# Patient Record
Sex: Male | Born: 1995 | Race: Black or African American | Hispanic: No | Smoking: Current every day smoker
Health system: Southern US, Community
[De-identification: ages and names within clinical notes are randomized; demographics above are authoritative.]

## PROBLEM LIST (undated history)

## (undated) DIAGNOSIS — Z789 Other specified health status: Secondary | ICD-10-CM

---

## 2021-09-18 ENCOUNTER — Emergency Department (HOSPITAL_COMMUNITY): Payer: Self-pay

## 2021-09-18 ENCOUNTER — Other Ambulatory Visit: Payer: Self-pay

## 2021-09-18 ENCOUNTER — Inpatient Hospital Stay (HOSPITAL_COMMUNITY)
Admission: EM | Admit: 2021-09-18 | Discharge: 2021-09-22 | DRG: 482 | Disposition: A | Discharge status: Home / Self Care | Payer: Self-pay | Attending: Orthopedic Surgery | Admitting: Orthopedic Surgery

## 2021-09-18 DIAGNOSIS — F17200 Nicotine dependence, unspecified, uncomplicated: Secondary | ICD-10-CM | POA: Diagnosis present

## 2021-09-18 DIAGNOSIS — T1490XA Injury, unspecified, initial encounter: Secondary | ICD-10-CM

## 2021-09-18 DIAGNOSIS — Z20822 Contact with and (suspected) exposure to covid-19: Secondary | ICD-10-CM | POA: Diagnosis present

## 2021-09-18 DIAGNOSIS — S7292XA Unspecified fracture of left femur, initial encounter for closed fracture: Secondary | ICD-10-CM

## 2021-09-18 DIAGNOSIS — S50812A Abrasion of left forearm, initial encounter: Secondary | ICD-10-CM | POA: Diagnosis present

## 2021-09-18 DIAGNOSIS — S72402A Unspecified fracture of lower end of left femur, initial encounter for closed fracture: Secondary | ICD-10-CM | POA: Diagnosis present

## 2021-09-18 DIAGNOSIS — T148XXA Other injury of unspecified body region, initial encounter: Secondary | ICD-10-CM

## 2021-09-18 DIAGNOSIS — T07XXXA Unspecified multiple injuries, initial encounter: Secondary | ICD-10-CM

## 2021-09-18 DIAGNOSIS — S80212A Abrasion, left knee, initial encounter: Secondary | ICD-10-CM | POA: Diagnosis present

## 2021-09-18 DIAGNOSIS — S72492A Other fracture of lower end of left femur, initial encounter for closed fracture: Principal | ICD-10-CM | POA: Diagnosis present

## 2021-09-18 DIAGNOSIS — S80811A Abrasion, right lower leg, initial encounter: Secondary | ICD-10-CM | POA: Diagnosis present

## 2021-09-18 DIAGNOSIS — S8992XA Unspecified injury of left lower leg, initial encounter: Secondary | ICD-10-CM | POA: Diagnosis present

## 2021-09-18 DIAGNOSIS — Y92410 Unspecified street and highway as the place of occurrence of the external cause: Secondary | ICD-10-CM | POA: Diagnosis not present

## 2021-09-18 HISTORY — DX: Other specified health status: Z78.9

## 2021-09-18 LAB — COMPREHENSIVE METABOLIC PANEL
ALT: 78 U/L — ABNORMAL HIGH (ref 0–44)
AST: 105 U/L — ABNORMAL HIGH (ref 15–41)
Albumin: 3.7 g/dL (ref 3.5–5.0)
Alkaline Phosphatase: 76 U/L (ref 38–126)
Anion gap: 11 (ref 5–15)
BUN: 12 mg/dL (ref 6–20)
CO2: 22 mmol/L (ref 22–32)
Calcium: 8.8 mg/dL — ABNORMAL LOW (ref 8.9–10.3)
Chloride: 104 mmol/L (ref 98–111)
Creatinine, Ser: 1.09 mg/dL (ref 0.61–1.24)
GFR, Estimated: >60 mL/min (ref >60)
Glucose, Bld: 111 mg/dL — ABNORMAL HIGH (ref 70–99)
Potassium: 3.8 mmol/L (ref 3.5–5.1)
Sodium: 137 mmol/L (ref 135–145)
Total Bilirubin: 0.3 mg/dL (ref 0.3–1.2)
Total Protein: 6.1 g/dL — ABNORMAL LOW (ref 6.5–8.1)

## 2021-09-18 LAB — CBC
HCT: 39.6 % (ref 39.0–52.0)
Hemoglobin: 13.3 g/dL (ref 13.0–17.0)
MCH: 28.7 pg (ref 26.0–34.0)
MCHC: 33.6 g/dL (ref 30.0–36.0)
MCV: 85.3 fL (ref 80.0–100.0)
Platelets: 275 10*3/uL (ref 150–400)
RBC: 4.64 MIL/uL (ref 4.22–5.81)
RDW: 12.5 % (ref 11.5–15.5)
WBC: 11.6 10*3/uL — ABNORMAL HIGH (ref 4.0–10.5)
nRBC: 0 % (ref 0.0–0.2)

## 2021-09-18 LAB — I-STAT CHEM 8, ED
BUN: 13 mg/dL (ref 6–20)
Calcium, Ion: 1.07 mmol/L — ABNORMAL LOW (ref 1.15–1.40)
Chloride: 104 mmol/L (ref 98–111)
Creatinine, Ser: 0.9 mg/dL (ref 0.61–1.24)
Glucose, Bld: 93 mg/dL (ref 70–99)
HCT: 42 % (ref 39.0–52.0)
Hemoglobin: 14.3 g/dL (ref 13.0–17.0)
Potassium: 4 mmol/L (ref 3.5–5.1)
Sodium: 137 mmol/L (ref 135–145)
TCO2: 24 mmol/L (ref 22–32)

## 2021-09-18 LAB — URINALYSIS, ROUTINE W REFLEX MICROSCOPIC
Bilirubin Urine: NEGATIVE
Glucose, UA: NEGATIVE mg/dL
Hgb urine dipstick: NEGATIVE
Ketones, ur: NEGATIVE mg/dL
Leukocytes,Ua: NEGATIVE
Nitrite: NEGATIVE
Protein, ur: NEGATIVE mg/dL
Specific Gravity, Urine: 1.024 (ref 1.005–1.030)
pH: 7 (ref 5.0–8.0)

## 2021-09-18 LAB — SAMPLE TO BLOOD BANK

## 2021-09-18 LAB — RESP PANEL BY RT-PCR (FLU A&B, COVID) ARPGX2
Influenza A by PCR: NEGATIVE
Influenza B by PCR: NEGATIVE
SARS Coronavirus 2 by RT PCR: NEGATIVE

## 2021-09-18 LAB — LACTIC ACID, PLASMA: Lactic Acid, Venous: 2.9 mmol/L (ref 0.5–1.9)

## 2021-09-18 MED ORDER — HYDROMORPHONE HCL 1 MG/ML IJ SOLN
0.5000 mg | INTRAMUSCULAR | Status: DC | PRN
  Administered 2021-09-19: 0.5 mg via INTRAVENOUS
  Administered 2021-09-19 – 2021-09-20 (×4): 1 mg via INTRAVENOUS
  Filled 2021-09-18 (×5): qty 1

## 2021-09-18 MED ORDER — SODIUM CHLORIDE 0.9 % IV SOLN: INTRAVENOUS | Status: DC

## 2021-09-18 MED ORDER — OXYCODONE HCL 5 MG PO TABS
5.0000 mg | ORAL_TABLET | ORAL | Status: DC | PRN
  Administered 2021-09-19 (×2): 5 mg via ORAL
  Filled 2021-09-18: qty 1
  Filled 2021-09-18: qty 2
  Filled 2021-09-18: qty 1

## 2021-09-18 MED ORDER — SODIUM CHLORIDE 0.9 % IV BOLUS
1000.0000 mL | Freq: Once | INTRAVENOUS | Status: AC
  Administered 2021-09-18: 1000 mL via INTRAVENOUS

## 2021-09-18 MED ORDER — METHOCARBAMOL 1000 MG/10ML IJ SOLN
500.0000 mg | Freq: Four times a day (QID) | INTRAVENOUS | Status: DC | PRN
  Filled 2021-09-18 (×3): qty 5

## 2021-09-18 MED ORDER — ENOXAPARIN SODIUM 40 MG/0.4ML IJ SOSY: 40.0000 mg | PREFILLED_SYRINGE | INTRAMUSCULAR | Status: DC

## 2021-09-18 MED ORDER — ONDANSETRON HCL 4 MG/2ML IJ SOLN
4.0000 mg | Freq: Four times a day (QID) | INTRAMUSCULAR | Status: DC | PRN
  Filled 2021-09-18: qty 2

## 2021-09-18 MED ORDER — HYDROMORPHONE HCL 1 MG/ML IJ SOLN
1.0000 mg | Freq: Once | INTRAMUSCULAR | Status: AC
  Administered 2021-09-18: 1 mg via INTRAVENOUS
  Filled 2021-09-18: qty 1

## 2021-09-18 MED ORDER — IOHEXOL 300 MG/ML  SOLN
100.0000 mL | Freq: Once | INTRAMUSCULAR | Status: AC | PRN
  Administered 2021-09-18: 100 mL via INTRAVENOUS

## 2021-09-18 MED ORDER — FENTANYL CITRATE PF 50 MCG/ML IJ SOSY
50.0000 ug | PREFILLED_SYRINGE | Freq: Once | INTRAMUSCULAR | Status: AC
  Administered 2021-09-18: 50 ug via INTRAVENOUS
  Filled 2021-09-18: qty 1

## 2021-09-18 MED ORDER — ACETAMINOPHEN 325 MG PO TABS
325.0000 mg | ORAL_TABLET | Freq: Four times a day (QID) | ORAL | Status: DC | PRN
  Administered 2021-09-20 (×2): 650 mg via ORAL
  Filled 2021-09-18 (×4): qty 2

## 2021-09-18 MED ORDER — OXYCODONE HCL 5 MG PO TABS
10.0000 mg | ORAL_TABLET | ORAL | Status: DC | PRN
  Administered 2021-09-18: 15 mg via ORAL
  Administered 2021-09-19: 10 mg via ORAL
  Administered 2021-09-20: 15 mg via ORAL
  Administered 2021-09-20: 10 mg via ORAL
  Administered 2021-09-20 – 2021-09-21 (×4): 15 mg via ORAL
  Filled 2021-09-18: qty 3
  Filled 2021-09-18: qty 2
  Filled 2021-09-18 (×2): qty 3
  Filled 2021-09-18: qty 2
  Filled 2021-09-18 (×3): qty 3

## 2021-09-18 MED ORDER — ONDANSETRON HCL 4 MG PO TABS: 4.0000 mg | ORAL_TABLET | Freq: Four times a day (QID) | ORAL | Status: DC | PRN

## 2021-09-18 MED ORDER — METHOCARBAMOL 500 MG PO TABS
500.0000 mg | ORAL_TABLET | Freq: Four times a day (QID) | ORAL | Status: DC | PRN
  Administered 2021-09-18 – 2021-09-22 (×5): 500 mg via ORAL
  Filled 2021-09-18 (×5): qty 1

## 2021-09-18 MED ORDER — ACETAMINOPHEN 500 MG PO TABS
1000.0000 mg | ORAL_TABLET | Freq: Four times a day (QID) | ORAL | Status: AC
  Administered 2021-09-18 – 2021-09-19 (×3): 1000 mg via ORAL
  Filled 2021-09-18 (×3): qty 2

## 2021-09-18 MED ORDER — KETOROLAC TROMETHAMINE 15 MG/ML IJ SOLN
15.0000 mg | Freq: Four times a day (QID) | INTRAMUSCULAR | Status: AC
  Administered 2021-09-19 (×4): 15 mg via INTRAVENOUS
  Filled 2021-09-18 (×4): qty 1

## 2021-09-18 NOTE — Progress Notes (Signed)
Orthopedic Tech Progress Note Patient Details:  Manuel Mckinney 1996-06-23 [id number]  Ortho Devices Type of Ortho Device: Knee Immobilizer Ortho Device/Splint Location: lle Ortho Device/Splint Interventions: Ordered  I left the knee immobilizer in room pt was in CT.     Zehra Rucci L Toshi Ishii 09/18/2021, 5:20 PM

## 2021-09-18 NOTE — ED Notes (Signed)
Pt repeatedly repositioned for comfort, yelling out periodically, RN in and out of room constantly attempting to reduce pain. Pt requested, then loosened knee immobilizer. Ortho tech consulted for advice, RN advised of securing methods for knee immobilizer, however pt reporting that this makes it worse. PRN to be given.
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
## 2021-09-18 NOTE — ED Notes (Signed)
C-collar removed at direction of MD Tegler
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
## 2021-09-18 NOTE — ED Notes (Signed)
Lactic 2.9. MD made aware.  

## 2021-09-19 ENCOUNTER — Encounter (HOSPITAL_COMMUNITY): Payer: Self-pay | Admitting: Orthopedic Surgery

## 2021-09-19 ENCOUNTER — Inpatient Hospital Stay (HOSPITAL_COMMUNITY): Payer: Self-pay | Admitting: Critical Care Medicine

## 2021-09-19 ENCOUNTER — Other Ambulatory Visit: Payer: Self-pay

## 2021-09-19 ENCOUNTER — Encounter (HOSPITAL_COMMUNITY)
Admission: EM | Disposition: A | Discharge status: Home / Self Care | Payer: Self-pay | Source: Home / Self Care | Attending: Orthopedic Surgery

## 2021-09-19 ENCOUNTER — Inpatient Hospital Stay (HOSPITAL_COMMUNITY): Payer: Self-pay

## 2021-09-19 DIAGNOSIS — S72402A Unspecified fracture of lower end of left femur, initial encounter for closed fracture: Secondary | ICD-10-CM

## 2021-09-19 HISTORY — PX: ORIF FEMUR FRACTURE: SHX2119

## 2021-09-19 LAB — MRSA NEXT GEN BY PCR, NASAL: MRSA by PCR Next Gen: NOT DETECTED

## 2021-09-19 LAB — ETHANOL: Alcohol, Ethyl (B): <10 mg/dL (ref <10)

## 2021-09-19 LAB — HIV ANTIBODY (ROUTINE TESTING W REFLEX): HIV Screen 4th Generation wRfx: NONREACTIVE

## 2021-09-19 SURGERY — OPEN REDUCTION INTERNAL FIXATION (ORIF) DISTAL FEMUR FRACTURE
Anesthesia: Regional | Site: Knee | Laterality: Left

## 2021-09-19 MED ORDER — PROPOFOL 10 MG/ML IV BOLUS
INTRAVENOUS | Status: AC
  Filled 2021-09-19: qty 20

## 2021-09-19 MED ORDER — VANCOMYCIN HCL 1000 MG IV SOLR
INTRAVENOUS | Status: DC | PRN
  Administered 2021-09-19: 1000 mg via TOPICAL

## 2021-09-19 MED ORDER — CEFAZOLIN SODIUM-DEXTROSE 2-4 GM/100ML-% IV SOLN
2.0000 g | INTRAVENOUS | Status: AC
  Administered 2021-09-19 (×2): 2 g via INTRAVENOUS
  Filled 2021-09-19: qty 100

## 2021-09-19 MED ORDER — ACETAMINOPHEN 10 MG/ML IV SOLN
1000.0000 mg | Freq: Once | INTRAVENOUS | Status: AC
  Administered 2021-09-19: 1000 mg via INTRAVENOUS
  Filled 2021-09-19: qty 100

## 2021-09-19 MED ORDER — CEFAZOLIN SODIUM-DEXTROSE 1-4 GM/50ML-% IV SOLN
1.0000 g | Freq: Four times a day (QID) | INTRAVENOUS | Status: AC
  Administered 2021-09-19 – 2021-09-20 (×3): 1 g via INTRAVENOUS
  Filled 2021-09-19 (×3): qty 50

## 2021-09-19 MED ORDER — LACTATED RINGERS IV SOLN: INTRAVENOUS | Status: DC

## 2021-09-19 MED ORDER — FENTANYL CITRATE (PF) 250 MCG/5ML IJ SOLN
INTRAMUSCULAR | Status: DC | PRN
  Administered 2021-09-19: 50 ug via INTRAVENOUS
  Administered 2021-09-19: 25 ug via INTRAVENOUS
  Administered 2021-09-19: 50 ug via INTRAVENOUS
  Administered 2021-09-19 (×5): 25 ug via INTRAVENOUS

## 2021-09-19 MED ORDER — MIDAZOLAM HCL 5 MG/5ML IJ SOLN
INTRAMUSCULAR | Status: DC | PRN
  Administered 2021-09-19: 2 mg via INTRAVENOUS

## 2021-09-19 MED ORDER — DOCUSATE SODIUM 100 MG PO CAPS
100.0000 mg | ORAL_CAPSULE | Freq: Two times a day (BID) | ORAL | Status: DC
  Administered 2021-09-19 – 2021-09-22 (×7): 100 mg via ORAL
  Filled 2021-09-19 (×7): qty 1

## 2021-09-19 MED ORDER — TRANEXAMIC ACID-NACL 1000-0.7 MG/100ML-% IV SOLN
1000.0000 mg | Freq: Once | INTRAVENOUS | Status: AC
  Administered 2021-09-19: 1000 mg via INTRAVENOUS
  Filled 2021-09-19: qty 100

## 2021-09-19 MED ORDER — FENTANYL CITRATE (PF) 100 MCG/2ML IJ SOLN: 25.0000 ug | INTRAMUSCULAR | Status: DC | PRN

## 2021-09-19 MED ORDER — CEFAZOLIN SODIUM-DEXTROSE 2-4 GM/100ML-% IV SOLN
INTRAVENOUS | Status: AC
  Filled 2021-09-19: qty 100

## 2021-09-19 MED ORDER — METOCLOPRAMIDE HCL 10 MG PO TABS: 5.0000 mg | ORAL_TABLET | Freq: Three times a day (TID) | ORAL | Status: DC | PRN

## 2021-09-19 MED ORDER — ONDANSETRON HCL 4 MG/2ML IJ SOLN
INTRAMUSCULAR | Status: DC | PRN
  Administered 2021-09-19: 4 mg via INTRAVENOUS

## 2021-09-19 MED ORDER — CHLORHEXIDINE GLUCONATE 4 % EX LIQD
60.0000 mL | Freq: Once | CUTANEOUS | Status: AC
  Administered 2021-09-19: 4 via TOPICAL
  Filled 2021-09-19: qty 60

## 2021-09-19 MED ORDER — CHLORHEXIDINE GLUCONATE 0.12 % MT SOLN
OROMUCOSAL | Status: AC
  Administered 2021-09-19: 15 mL via OROMUCOSAL
  Filled 2021-09-19: qty 15

## 2021-09-19 MED ORDER — ORAL CARE MOUTH RINSE: 15.0000 mL | Freq: Once | OROMUCOSAL | Status: AC

## 2021-09-19 MED ORDER — PROPOFOL 10 MG/ML IV BOLUS
INTRAVENOUS | Status: DC | PRN
Start: 2021-09-19 — End: 2021-09-19
  Administered 2021-09-19: 150 mg via INTRAVENOUS
  Administered 2021-09-19: 50 mg via INTRAVENOUS

## 2021-09-19 MED ORDER — ENOXAPARIN SODIUM 40 MG/0.4ML IJ SOSY
40.0000 mg | PREFILLED_SYRINGE | INTRAMUSCULAR | Status: DC
  Administered 2021-09-20 – 2021-09-22 (×3): 40 mg via SUBCUTANEOUS
  Filled 2021-09-19 (×3): qty 0.4

## 2021-09-19 MED ORDER — CLONIDINE HCL (ANALGESIA) 100 MCG/ML EP SOLN
EPIDURAL | Status: DC | PRN
  Administered 2021-09-19: 100 ug

## 2021-09-19 MED ORDER — ROPIVACAINE HCL 5 MG/ML IJ SOLN
INTRAMUSCULAR | Status: DC | PRN
  Administered 2021-09-19: 30 mL via PERINEURAL

## 2021-09-19 MED ORDER — PHENYLEPHRINE HCL-NACL 20-0.9 MG/250ML-% IV SOLN
INTRAVENOUS | Status: DC | PRN
  Administered 2021-09-19: 30 ug/min via INTRAVENOUS

## 2021-09-19 MED ORDER — VANCOMYCIN HCL 1000 MG IV SOLR
INTRAVENOUS | Status: AC
  Filled 2021-09-19: qty 20

## 2021-09-19 MED ORDER — ONDANSETRON HCL 4 MG PO TABS: 4.0000 mg | ORAL_TABLET | Freq: Four times a day (QID) | ORAL | Status: DC | PRN

## 2021-09-19 MED ORDER — DEXMEDETOMIDINE (PRECEDEX) IN NS 20 MCG/5ML (4 MCG/ML) IV SYRINGE
PREFILLED_SYRINGE | INTRAVENOUS | Status: DC | PRN
  Administered 2021-09-19: 4 ug via INTRAVENOUS
  Administered 2021-09-19: 8 ug via INTRAVENOUS
  Administered 2021-09-19 (×2): 4 ug via INTRAVENOUS

## 2021-09-19 MED ORDER — ONDANSETRON HCL 4 MG/2ML IJ SOLN: 4.0000 mg | Freq: Four times a day (QID) | INTRAMUSCULAR | Status: DC | PRN

## 2021-09-19 MED ORDER — SUGAMMADEX SODIUM 200 MG/2ML IV SOLN
INTRAVENOUS | Status: DC | PRN
Start: 2021-09-19 — End: 2021-09-19
  Administered 2021-09-19: 200 mg via INTRAVENOUS

## 2021-09-19 MED ORDER — METOCLOPRAMIDE HCL 5 MG/ML IJ SOLN: 5.0000 mg | Freq: Three times a day (TID) | INTRAMUSCULAR | Status: DC | PRN

## 2021-09-19 MED ORDER — CHLORHEXIDINE GLUCONATE 0.12 % MT SOLN: 15.0000 mL | Freq: Once | OROMUCOSAL | Status: AC

## 2021-09-19 MED ORDER — MIDAZOLAM HCL 2 MG/2ML IJ SOLN
INTRAMUSCULAR | Status: AC
  Filled 2021-09-19: qty 2

## 2021-09-19 MED ORDER — TRANEXAMIC ACID-NACL 1000-0.7 MG/100ML-% IV SOLN
1000.0000 mg | INTRAVENOUS | Status: AC
  Administered 2021-09-19: 1000 mg via INTRAVENOUS
  Filled 2021-09-19: qty 100

## 2021-09-19 MED ORDER — ROCURONIUM BROMIDE 10 MG/ML (PF) SYRINGE
PREFILLED_SYRINGE | INTRAVENOUS | Status: DC | PRN
Start: 2021-09-19 — End: 2021-09-19
  Administered 2021-09-19: 50 mg via INTRAVENOUS

## 2021-09-19 MED ORDER — PHENYLEPHRINE 40 MCG/ML (10ML) SYRINGE FOR IV PUSH (FOR BLOOD PRESSURE SUPPORT)
PREFILLED_SYRINGE | INTRAVENOUS | Status: DC | PRN
  Administered 2021-09-19: 40 ug via INTRAVENOUS
  Administered 2021-09-19: 160 ug via INTRAVENOUS
  Administered 2021-09-19 (×3): 80 ug via INTRAVENOUS

## 2021-09-19 MED ORDER — FENTANYL CITRATE (PF) 250 MCG/5ML IJ SOLN
INTRAMUSCULAR | Status: AC
  Filled 2021-09-19: qty 5

## 2021-09-19 MED ORDER — POVIDONE-IODINE 10 % EX SWAB: 2.0000 "application " | Freq: Once | CUTANEOUS | Status: DC

## 2021-09-19 MED ORDER — DEXAMETHASONE SODIUM PHOSPHATE 10 MG/ML IJ SOLN
INTRAMUSCULAR | Status: DC | PRN
  Administered 2021-09-19: 4 mg via INTRAVENOUS

## 2021-09-19 MED ORDER — LIDOCAINE 2% (20 MG/ML) 5 ML SYRINGE
INTRAMUSCULAR | Status: DC | PRN
  Administered 2021-09-19: 60 mg via INTRAVENOUS

## 2021-09-19 MED ORDER — 0.9 % SODIUM CHLORIDE (POUR BTL) OPTIME
TOPICAL | Status: DC | PRN
  Administered 2021-09-19: 1000 mL

## 2021-09-19 SURGICAL SUPPLY — 77 items
BAG COUNTER SPONGE SURGICOUNT (BAG) ×2 IMPLANT
BAG SPNG CNTER NS LX DISP (BAG) ×1
BIT DRILL 4.3 CANNULATED (BIT) IMPLANT
BIT DRILL CANN 3.0 QC 215 (BIT) ×1 IMPLANT
BIT DRILL CANN QC 4.3X180 (BIT) IMPLANT
BIT DRILL GUIDEWIRE 2.5X200 (WIRE) ×2 IMPLANT
BIT DRILL Q/COUPLING 1 (BIT) ×1 IMPLANT
BLADE CLIPPER SURG (BLADE) IMPLANT
BNDG ELASTIC 4X5.8 VLCR STR LF (GAUZE/BANDAGES/DRESSINGS) ×2 IMPLANT
BNDG ELASTIC 6X5.8 VLCR STR LF (GAUZE/BANDAGES/DRESSINGS) ×2 IMPLANT
BNDG GAUZE ELAST 4 BULKY (GAUZE/BANDAGES/DRESSINGS) ×1 IMPLANT
BRUSH SCRUB EZ PLAIN DRY (MISCELLANEOUS) ×3 IMPLANT
CANISTER SUCT 3000ML PPV (MISCELLANEOUS) ×2 IMPLANT
COVER SURGICAL LIGHT HANDLE (MISCELLANEOUS) ×2 IMPLANT
DRAPE C-ARM 42X72 X-RAY (DRAPES) ×2 IMPLANT
DRAPE C-ARMOR (DRAPES) ×2 IMPLANT
DRAPE IMP U-DRAPE 54X76 (DRAPES) ×2 IMPLANT
DRAPE ORTHO SPLIT 77X108 STRL (DRAPES) ×4
DRAPE SURG ORHT 6 SPLT 77X108 (DRAPES) ×3 IMPLANT
DRAPE U-SHAPE 47X51 STRL (DRAPES) ×3 IMPLANT
DRILL BIT 4.3 CANNULATED (BIT) ×2
DRILL BIT 4.3MM (BIT) ×4
DRSG ADAPTIC 3X8 NADH LF (GAUZE/BANDAGES/DRESSINGS) ×2 IMPLANT
DRSG AQUACEL AG ADV 3.5X10 (GAUZE/BANDAGES/DRESSINGS) ×1 IMPLANT
DRSG PAD ABDOMINAL 8X10 ST (GAUZE/BANDAGES/DRESSINGS) ×5 IMPLANT
ELECT REM PT RETURN 9FT ADLT (ELECTROSURGICAL) ×2
ELECTRODE REM PT RTRN 9FT ADLT (ELECTROSURGICAL) ×1 IMPLANT
EVACUATOR 1/8 PVC DRAIN (DRAIN) IMPLANT
EVACUATOR 3/16  PVC DRAIN (DRAIN)
EVACUATOR 3/16 PVC DRAIN (DRAIN) IMPLANT
GAUZE SPONGE 4X4 12PLY STRL (GAUZE/BANDAGES/DRESSINGS) ×2 IMPLANT
GLOVE SRG 8 PF TXTR STRL LF DI (GLOVE) ×1 IMPLANT
GLOVE SURG ENC MOIS LTX SZ8 (GLOVE) ×2 IMPLANT
GLOVE SURG ORTHO LTX SZ7.5 (GLOVE) ×4 IMPLANT
GLOVE SURG UNDER POLY LF SZ7.5 (GLOVE) ×2 IMPLANT
GLOVE SURG UNDER POLY LF SZ8 (GLOVE) ×2
GOWN STRL REUS W/ TWL LRG LVL3 (GOWN DISPOSABLE) ×2 IMPLANT
GOWN STRL REUS W/ TWL XL LVL3 (GOWN DISPOSABLE) ×1 IMPLANT
GOWN STRL REUS W/TWL LRG LVL3 (GOWN DISPOSABLE) ×4
GOWN STRL REUS W/TWL XL LVL3 (GOWN DISPOSABLE) ×2
GUIDEWIRE 1.6X220 TOCAR TIP (WIRE) ×3 IMPLANT
KIT BASIN OR (CUSTOM PROCEDURE TRAY) ×2 IMPLANT
KIT TURNOVER KIT B (KITS) ×2 IMPLANT
NEEDLE 22X1 1/2 (OR ONLY) (NEEDLE) IMPLANT
NS IRRIG 1000ML POUR BTL (IV SOLUTION) ×2 IMPLANT
PACK TOTAL JOINT (CUSTOM PROCEDURE TRAY) ×2 IMPLANT
PACK UNIVERSAL I (CUSTOM PROCEDURE TRAY) ×1 IMPLANT
PAD ARMBOARD 7.5X6 YLW CONV (MISCELLANEOUS) ×3 IMPLANT
PAD CAST 4YDX4 CTTN HI CHSV (CAST SUPPLIES) ×1 IMPLANT
PADDING CAST COTTON 4X4 STRL (CAST SUPPLIES)
PADDING CAST COTTON 6X4 STRL (CAST SUPPLIES) ×2 IMPLANT
PLATE VA-LCP CONDYLAR 8H (Plate) ×1 IMPLANT
SCREW CANN HDLS 4.5X80 (Screw) ×2 IMPLANT
SCREW CANN HDLS LNG 4.5X70 (Screw) ×1 IMPLANT
SCREW CONICAL 5.0MM 85MM (Screw) ×1 IMPLANT
SCREW CORTEX ST 4.5X44 (Screw) ×1 IMPLANT
SCREW CORTEX ST 4.5X48 (Screw) ×1 IMPLANT
SCREW CORTEX ST 4.5X52 (Screw) ×1 IMPLANT
SCREW LOCKING VA 5.0X70MM (Screw) ×1 IMPLANT
SCREW LOCKING VA 5.0X75MM (Screw) ×1 IMPLANT
SCREW LOCKING VA 5.0X90MM (Screw) ×1 IMPLANT
SPONGE T-LAP 18X18 ~~LOC~~+RFID (SPONGE) ×3 IMPLANT
STAPLER VISISTAT 35W (STAPLE) ×2 IMPLANT
SUCTION FRAZIER HANDLE 10FR (MISCELLANEOUS)
SUCTION TUBE FRAZIER 10FR DISP (MISCELLANEOUS) ×1 IMPLANT
SUT PROLENE 0 CT 2 (SUTURE) IMPLANT
SUT VIC AB 0 CT1 27 (SUTURE) ×6
SUT VIC AB 0 CT1 27XBRD ANBCTR (SUTURE) ×2 IMPLANT
SUT VIC AB 1 CT1 27 (SUTURE) ×4
SUT VIC AB 1 CT1 27XBRD ANBCTR (SUTURE) ×2 IMPLANT
SUT VIC AB 2-0 CT1 27 (SUTURE) ×4
SUT VIC AB 2-0 CT1 TAPERPNT 27 (SUTURE) ×2 IMPLANT
SYR 20ML ECCENTRIC (SYRINGE) IMPLANT
TOWEL GREEN STERILE (TOWEL DISPOSABLE) ×4 IMPLANT
TOWEL GREEN STERILE FF (TOWEL DISPOSABLE) ×2 IMPLANT
TRAY FOLEY MTR SLVR 16FR STAT (SET/KITS/TRAYS/PACK) IMPLANT
WATER STERILE IRR 1000ML POUR (IV SOLUTION) ×4 IMPLANT
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
## 2021-09-19 NOTE — Anesthesia Postprocedure Evaluation (Signed)
Anesthesia Post Note  Patient: Aleck Locklin  Procedure(s) Performed: OPEN REDUCTION INTERNAL FIXATION (ORIF) DISTAL FEMUR FRACTURE (Left: Knee)     Patient location during evaluation: PACU Anesthesia Type: Regional and General Level of consciousness: awake and alert Pain management: pain level controlled Vital Signs Assessment: post-procedure vital signs reviewed and stable Respiratory status: spontaneous breathing, nonlabored ventilation, respiratory function stable and patient connected to nasal cannula oxygen Cardiovascular status: blood pressure returned to baseline and stable Postop Assessment: no apparent nausea or vomiting Anesthetic complications: no   No notable events documented.  Last Vitals:  Vitals:   09/19/21 1137 09/19/21 1505  BP: (!) 131/94 134/87  Pulse: 63 75  Resp: 12 15  Temp: 36.9 C 37.7 C  SpO2: 98% 95%    Last Pain:  Vitals:   09/19/21 1505  TempSrc: Oral  PainSc:                  Nelle Don Tanasia Budzinski

## 2021-09-19 NOTE — Anesthesia Procedure Notes (Addendum)
Anesthesia Regional Block: Femoral nerve block   Pre-Anesthetic Checklist: , timeout performed,  Correct Patient, Correct Site, Correct Laterality,  Correct Procedure, Correct Position, site marked,  Risks and benefits discussed,  Surgical consent,  Pre-op evaluation,  At surgeon's request and post-op pain management  Laterality: Left  Prep: Dura Prep       Needles:  Injection technique: Single-shot  Needle Type: Echogenic Stimulator Needle     Needle Length: 10cm  Needle Gauge: 20     Additional Needles:   Procedures:,,,, ultrasound used (permanent image in chart),,    Narrative:  Start time: 09/19/2021 7:20 AM End time: 09/19/2021 7:27 AM Injection made incrementally with aspirations every 5 mL.  Performed by: Personally  Anesthesiologist: Atilano Median, DO  Additional Notes: Patient identified. Risks/Benefits/Options discussed with patient including but not limited to bleeding, infection, nerve damage, failed block, incomplete pain control. Patient expressed understanding and wished to proceed. All questions were answered. Sterile technique was used throughout the entire procedure. Please see nursing notes for vital signs. Aspirated in 5cc intervals with injection for negative confirmation. Patient was given instructions on fall risk and not to get out of bed. All questions and concerns addressed with instructions to call with any issues or inadequate analgesia.

## 2021-09-19 NOTE — Anesthesia Preprocedure Evaluation (Addendum)
Anesthesia Evaluation  Patient identified by MRN, date of birth, ID band Patient awake    Reviewed: Allergy & Precautions, NPO status , Patient's Chart, lab work & pertinent test results  Airway Mallampati: II  TM Distance: >3 FB Neck ROM: Full    Dental no notable dental hx.    Pulmonary Current Smoker,    Pulmonary exam normal        Cardiovascular negative cardio ROS   Rhythm:Regular Rate:Normal     Neuro/Psych negative neurological ROS  negative psych ROS   GI/Hepatic negative GI ROS, Neg liver ROS,   Endo/Other  negative endocrine ROS  Renal/GU negative Renal ROS  negative genitourinary   Musculoskeletal Left distal femur fx   Abdominal Normal abdominal exam  (+)   Peds  Hematology negative hematology ROS (+)   Anesthesia Other Findings   Reproductive/Obstetrics                            Anesthesia Physical Anesthesia Plan  ASA: 1  Anesthesia Plan: General and Regional   Post-op Pain Management: Regional block* and Tylenol PO (pre-op)*   Induction: Intravenous  PONV Risk Score and Plan: 2 and Ondansetron, Dexamethasone, Midazolam and Treatment may vary due to age or medical condition  Airway Management Planned: Mask and Oral ETT  Additional Equipment: None  Intra-op Plan:   Post-operative Plan: Extubation in OR  Informed Consent: I have reviewed the patients History and Physical, chart, labs and discussed the procedure including the risks, benefits and alternatives for the proposed anesthesia with the patient or authorized representative who has indicated his/her understanding and acceptance.     Dental advisory given  Plan Discussed with: CRNA  Anesthesia Plan Comments: (Lab Results      Component                Value               Date                      WBC                      11.6 (H)            09/18/2021                HGB                      14.3                 09/18/2021                HCT                      42.0                09/18/2021                MCV                      85.3                09/18/2021                PLT                      275  09/18/2021           Lab Results      Component                Value               Date                      NA                       137                 09/18/2021                K                        4.0                 09/18/2021                CO2                      22                  09/18/2021                GLUCOSE                  93                  09/18/2021                BUN                      13                  09/18/2021                CREATININE               0.90                09/18/2021                CALCIUM                  8.8 (L)             09/18/2021                GFRNONAA                 >60                 09/18/2021          )        Anesthesia Quick Evaluation

## 2021-09-19 NOTE — Transfer of Care (Signed)
Immediate Anesthesia Transfer of Care Note  Patient: Manuel Mckinney  Procedure(s) Performed: OPEN REDUCTION INTERNAL FIXATION (ORIF) DISTAL FEMUR FRACTURE (Left: Knee)  Patient Location: PACU  Anesthesia Type:General  Level of Consciousness: awake, alert  and oriented  Airway & Oxygen Therapy: Patient Spontanous Breathing and Patient connected to nasal cannula oxygen  Post-op Assessment: Report given to RN and Post -op Vital signs reviewed and stable  Post vital signs: Reviewed and stable  Last Vitals:  Vitals Value Taken Time  BP 132/84 09/19/21 1048  Temp    Pulse 71 09/19/21 1049  Resp 13 09/19/21 1049  SpO2 96 % 09/19/21 1049  Vitals shown include unvalidated device data.  Last Pain:  Vitals:   09/19/21 0713  TempSrc: Oral  PainSc:          Complications: No notable events documented.

## 2021-09-19 NOTE — Anesthesia Procedure Notes (Addendum)
Procedure Name: Intubation Date/Time: 09/19/2021 8:14 AM Performed by: Wilburn Cornelia, CRNA Pre-anesthesia Checklist: Patient identified, Emergency Drugs available, Suction available, Patient being monitored and Timeout performed Patient Re-evaluated:Patient Re-evaluated prior to induction Oxygen Delivery Method: Circle system utilized Preoxygenation: Pre-oxygenation with 100% oxygen Induction Type: IV induction Ventilation: Mask ventilation without difficulty Laryngoscope Size: Mac and 4 Grade View: Grade I Tube type: Oral Tube size: 7.5 mm Number of attempts: 1 Airway Equipment and Method: Stylet Placement Confirmation: ETT inserted through vocal cords under direct vision, CO2 detector, positive ETCO2 and breath sounds checked- equal and bilateral Secured at: 23 cm Tube secured with: Tape Dental Injury: Teeth and Oropharynx as per pre-operative assessment
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
## 2021-09-19 NOTE — Brief Op Note (Signed)
09/18/2021 - 09/19/2021  10:29 AM  PATIENT:  Manuel Mckinney  26 y.o. male  PRE-OPERATIVE DIAGNOSIS:  LEFT DISTAL FEMUR FRACTURE  POST-OPERATIVE DIAGNOSIS:  LEFT DISTAL FEMUR FRACTURE  PROCEDURE:  Procedure(s): OPEN REDUCTION INTERNAL FIXATION (ORIF) DISTAL FEMUR FRACTURE (Left)  SURGEON:  Surgeon(s) and Role:    * Yolonda Kida, MD - Primary  PHYSICIAN ASSISTANT: Dion Saucier, PA-C  ANESTHESIA:   regional and general  EBL:  150 mL   BLOOD ADMINISTERED:none  DRAINS: none   LOCAL MEDICATIONS USED:  NONE  SPECIMEN:  No Specimen  DISPOSITION OF SPECIMEN:  N/A  COUNTS:  YES  TOURNIQUET:  * Missing tourniquet times found for documented tourniquets in log: [id number] *  DICTATION: .Note written in EPIC  PLAN OF CARE: Admit to inpatient   PATIENT DISPOSITION:  PACU - hemodynamically stable.   Delay start of Pharmacological VTE agent (>24hrs) due to surgical blood loss or risk of bleeding: not applicable

## 2021-09-20 ENCOUNTER — Encounter (HOSPITAL_COMMUNITY): Payer: Self-pay | Admitting: Orthopedic Surgery

## 2021-09-20 NOTE — Progress Notes (Signed)
Subjective: 1 Day Post-Op Procedure(s) (LRB): OPEN REDUCTION INTERNAL FIXATION (ORIF) DISTAL FEMUR FRACTURE (Left) Patient seen in rounds for Dr. Aundria Rud Patient reports pain as 10 on 0-10 scale.   Pain is not well controlled on PO pain meds Reports pain in entire lower extremity He has not been able to get up with PT yet  Objective: Vital signs in last 24 hours: Temp:  [97.7 F (36.5 C)-100.9 F (38.3 C)] 99 F (37.2 C) (02/26 0316) Pulse Rate:  [61-76] 61 (02/26 0316) Resp:  [12-19] 19 (02/26 0316) BP: (121-142)/(80-94) 142/93 (02/26 0316) SpO2:  [95 %-100 %] 100 % (02/26 0316)  Intake/Output from previous day: 02/25 0701 - 02/26 0700 In: 1882.2 [I.V.:1632.2; IV Piggyback:250] Out: 2300 [Urine:2150; Blood:150] Intake/Output this shift: No intake/output data recorded.  Recent Labs    09/18/21 1559 09/18/21 1704  HGB 13.3 14.3   Recent Labs    09/18/21 1559 09/18/21 1704  WBC 11.6*  --   RBC 4.64  --   HCT 39.6 42.0  PLT 275  --    Recent Labs    09/18/21 1559 09/18/21 1704  NA 137 137  K 3.8 4.0  CL 104 104  CO2 22  --   BUN 12 13  CREATININE 1.09 0.90  GLUCOSE 111* 93  CALCIUM 8.8*  --    No results for input(s): LABPT, INR in the last 72 hours.  Neurologically intact Neurovascular intact Sensation intact distally Intact pulses distally Dorsiflexion/Plantar flexion intact Incision: dressing C/D/I Compartment soft   Assessment/Plan: 1 Day Post-Op Procedure(s) (LRB): OPEN REDUCTION INTERNAL FIXATION (ORIF) DISTAL FEMUR FRACTURE (Left) Advance diet Up with therapy, NWB left lower extremity DVT ppx: Lovenox  Continue with icing and elevating left leg    Ceasar Mons [phone] 09/20/2021, 7:30 AM
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
## 2021-09-20 NOTE — Progress Notes (Signed)
°  Transition of Care [facility]) Screening Note   Patient Details  Name: Manuel Mckinney Date of Birth: 12-17-1995   Transition of Care [facility]) CM/SW Contact:    Alfredia Ferguson, LCSW Phone Number: 09/20/2021, 8:21 AM    Transition of Care Department [facility]) has reviewed patient and noted no immediate TOC needs pending continued medical work-up. TOC team will continue to monitor patient advancement through interdisciplinary progression rounds to support any identified discharge supports as needed. If new patient transition needs arise, please place a TOC consult or reach out to [facility] team.

## 2021-09-21 ENCOUNTER — Encounter (HOSPITAL_COMMUNITY): Payer: Self-pay | Admitting: Orthopedic Surgery

## 2021-09-21 MED ORDER — GABAPENTIN 100 MG PO CAPS
200.0000 mg | ORAL_CAPSULE | Freq: Two times a day (BID) | ORAL | Status: DC
  Administered 2021-09-21 – 2021-09-22 (×2): 200 mg via ORAL
  Filled 2021-09-21 (×2): qty 2

## 2021-09-21 MED ORDER — KETOROLAC TROMETHAMINE 30 MG/ML IJ SOLN
30.0000 mg | Freq: Four times a day (QID) | INTRAMUSCULAR | Status: DC
  Administered 2021-09-21 – 2021-09-22 (×5): 30 mg via INTRAVENOUS
  Filled 2021-09-21 (×5): qty 1
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
## 2021-09-21 NOTE — Progress Notes (Signed)
Mobility Specialist: Progress Note   09/21/21 1709  Mobility  Activity Refused mobility   Pt refused mobility d/t pain in LLE, no rating given. Will f/u as able.   [facility] Mobility Specialist Mobility Specialist 5 [address]: [phone] Mobility Specialist 6 [address]: [phone]

## 2021-09-21 NOTE — Discharge Instructions (Addendum)
Orthopedic discharge instructions:  Weightbearing: You are to be nonweightbearing meaning putting no pressure on the left leg for the first 4 weeks after surgery to prevent any movement at your fracture and allow healing. Use your rolling walker to help get around.   Range of motion: You are allowed to be range of motion as tolerated meaning that you can move your knee and bend and straighten it is much as you can to help prevent stiffness.  Dressings: Maintain your dressing with Ace wrap for 3 days postoperatively and then you can change this daily with a simple gauze and Ace wrap.  You should keep your wound covered for the first 2 weeks.  You can begin showering after day 7 but no submerging in a bathtub.  Pain control: Take ibuprofen and Tylenol around-the-clock and for severe pain take oxycodone as prescribed.  You have also been sent methocarbamol which is a muscle relaxer which will help with pain.   Blood clot prevention: You are to take 81 mg of aspirin every day for blood clot prevention for 6 weeks.  If you get extreme pain in the calf, redness of the calf, extreme swelling of the calf this is concerning for blood clot please call the office to be evaluated. This is important to take as blood clots can be life threatening.   Follow-up: You will follow-up in our office at [facility] 2 weeks from surgery with [facility] PA-C or Dr. Stann Mainland.  Call [phone] to make an appointment.  At that visit we will get new x-rays and take your staples out.
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
## 2021-09-22 ENCOUNTER — Other Ambulatory Visit (HOSPITAL_COMMUNITY): Payer: Self-pay

## 2021-09-22 MED ORDER — METHOCARBAMOL 500 MG PO TABS
500.0000 mg | ORAL_TABLET | Freq: Four times a day (QID) | ORAL | 0 refills | Status: AC
  Filled 2021-09-22: qty 30, 8d supply, fill #0

## 2021-09-22 MED ORDER — OXYCODONE HCL 5 MG PO TABS
5.0000 mg | ORAL_TABLET | ORAL | 0 refills | Status: AC | PRN
Start: 2021-09-22 — End: 2022-09-22
  Filled 2021-09-22: qty 24, 4d supply, fill #0

## 2021-09-22 NOTE — TOC Progression Note (Addendum)
Transition of Care [facility]) - Progression Note    Patient Details  Name: Abdullatif Turrentine MRN: [id number] Date of Birth: 19-Jul-1996  Transition of Care [facility]) CM/SW [place], RN Phone Number:[phone]  09/22/2021, 2:07 PM  Clinical Narrative:    Rolling walker and shower stool referral has been called to [employer] and will be delivered to the bedside.  1424 Patient refusing to wait for DME to be delivered in spite of CM talking to him to make him aware that [employer] would be delivering shortly. Patient states he knows someone with a walker and a shower stool. Patient also states that he has already received his medications from [place].         Expected Discharge Plan and Services           Expected Discharge Date: 09/22/21                                     Social Determinants of Health (SDOH) Interventions    Readmission Risk Interventions No flowsheet data found.

## 2021-09-22 NOTE — Progress Notes (Signed)
Pt refused to wait for his walker and shower chair. Case manager and nurse explained to patient it wouldn't be much longer before equipment arrived. Could not have it delivered due to insurance. Still did not want to wait. Patient d/c home via friend with paperwork and stated "his brother had equipment he could borrow". Pt alert and oriented x4. VSS. Pt c/o no pain at this time. No signs of respiratory distress. Education complete and care plans resolved. IV removed with catheter intact and pt tolerated well. No further issues at this time. Pt to follow up with PCP. Jillyn Hidden, RN
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
## 2021-09-22 NOTE — Progress Notes (Signed)
Rolling walker and shower stool ordered for patient to receive upon discharge.  Medications were sent to transition of care pharmacy however if there is any issues with this please message me so I can send medications to pharmacy of patient's choice.

## 2021-10-16 ENCOUNTER — Emergency Department (HOSPITAL_COMMUNITY)
Admission: EM | Admit: 2021-10-16 | Discharge: 2021-10-16 | Disposition: A | Discharge status: Home / Self Care | Payer: Self-pay | Attending: Emergency Medicine | Admitting: Emergency Medicine

## 2021-10-16 ENCOUNTER — Encounter (HOSPITAL_COMMUNITY): Payer: Self-pay | Admitting: Emergency Medicine

## 2021-10-16 ENCOUNTER — Other Ambulatory Visit: Payer: Self-pay

## 2021-10-16 DIAGNOSIS — Z4802 Encounter for removal of sutures: Secondary | ICD-10-CM | POA: Insufficient documentation

## 2021-10-16 NOTE — ED Provider Notes (Signed)
?[facility] EMERGENCY DEPARTMENT ?Provider Note ? ? ?CSN: [id number] ?Arrival date & time: 10/16/21  2015 ? ?  ? ?History ? ?Chief Complaint  ?Patient presents with  ? Suture / Staple Removal  ? ? ?Manuel Mckinney is a 26 y.o. male.  Patient presents to the ED for a staple removal.  He had surgery of the femur fracture on February 25.  This was done by Dr. Aundria Rud from [facility].  He denies any concerns with the site as far as redness, drainage, or swelling. ? ? ?Suture / Staple Removal ? ? ?  ? ?Home Medications ?Prior to Admission medications   ?Medication Sig Start Date End Date Taking? Authorizing Provider  ?methocarbamol (ROBAXIN) 500 MG tablet Take 1 tablet (500 mg total) by mouth 4 (four) times daily. For muscle spasms 09/22/21   Dion Saucier D, PA  ?oxyCODONE (ROXICODONE) 5 MG immediate release tablet Take 1 tablet (5 mg total) by mouth every 4 (four) hours as needed for severe pain. 09/22/21 09/22/22  Dion Saucier D, PA  ?   ? ?Allergies    ?Patient has no known allergies.   ? ?Review of Systems   ?Review of Systems  ?Skin:  Positive for wound.  ?All other systems reviewed and are negative. ? ?Physical Exam ?Updated Vital Signs ?BP (!) 140/101 (BP Location: Right Arm)   Pulse (!) 117   Temp 98.6 ?F (37 ?C)   Resp 18   SpO2 98%  ?Physical Exam ?Vitals and nursing note reviewed.  ?Constitutional:   ?   General: He is not in acute distress. ?   Appearance: Normal appearance. He is well-developed. He is not ill-appearing, toxic-appearing or diaphoretic.  ?HENT:  ?   Head: Normocephalic and atraumatic.  ?   Nose: No nasal deformity.  ?   Mouth/Throat:  ?   Lips: Pink. No lesions.  ?Eyes:  ?   General: Gaze aligned appropriately. No scleral icterus.    ?   Right eye: No discharge.     ?   Left eye: No discharge.  ?   Conjunctiva/sclera: Conjunctivae normal.  ?   Right eye: Right conjunctiva is not injected. No exudate or hemorrhage. ?   Left eye: Left conjunctiva is not injected. No  exudate or hemorrhage. ?Pulmonary:  ?   Effort: Pulmonary effort is normal. No respiratory distress.  ?Skin: ?   General: Skin is warm and dry.  ?   Comments: Large incision site on the lateral aspect of the left thigh.  No drainage noted.  There is no erythema or swelling around the site.  ?Neurological:  ?   Mental Status: He is alert and oriented to person, place, and time.  ?Psychiatric:     ?   Mood and Affect: Mood normal.     ?   Speech: Speech normal.     ?   Behavior: Behavior normal. Behavior is cooperative.  ? ? ?ED Results / Procedures / Treatments   ?Labs ?(all labs ordered are listed, but only abnormal results are displayed) ?Labs Reviewed - No data to display ? ?EKG ?None ? ?Radiology ?No results found. ? ?Procedures ?Procedures  ? ?Medications Ordered in ED ?Medications - No data to display ? ?ED Course/ Medical Decision Making/ A&P ?  ?                        ?Medical Decision Making ? ?Patient here for staple removal.  There are  no signs of infection at the site.  Patient with no systemic symptoms.  I discussed with patient that I do not typically remove staples from his surgery.  He needs to have this done by the orthopedic surgeon that I did this surgery as I do not know the appropriate timeframe as to remove these.  I recommend that he calls [facility] and has a set up. ? ?Final Clinical Impression(s) / ED Diagnoses ?Final diagnoses:  ?Encounter for staple removal  ? ? ?Rx / DC Orders ?ED Discharge Orders   ? ? None  ? ?  ? ? ?  ?Claudie Leach, PA-C ?10/16/21 2131 ? ?  ?Tegeler, Canary Brim, MD ?10/18/21 2043 ? ?

## 2021-10-16 NOTE — ED Triage Notes (Signed)
Pt arrive from home to have staples from surgery removed. Surgery was a month ago. ?

## 2021-10-16 NOTE — Discharge Instructions (Signed)
Call Dr. Aundria Rud in regards to having your staples removed since he is the person who did your surgery. ?
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
## 2023-03-04 IMAGING — RF DG FEMUR 2+V*L*
1 series · 6 of 6 positions shown · non-contrast
Comparison: Study done earlier today

CLINICAL DATA: Fracture distal left femur

EXAM:
LEFT FEMUR 2 VIEWS

[Series 1: run · 6 of 6 slices shown]
[im 1/6]
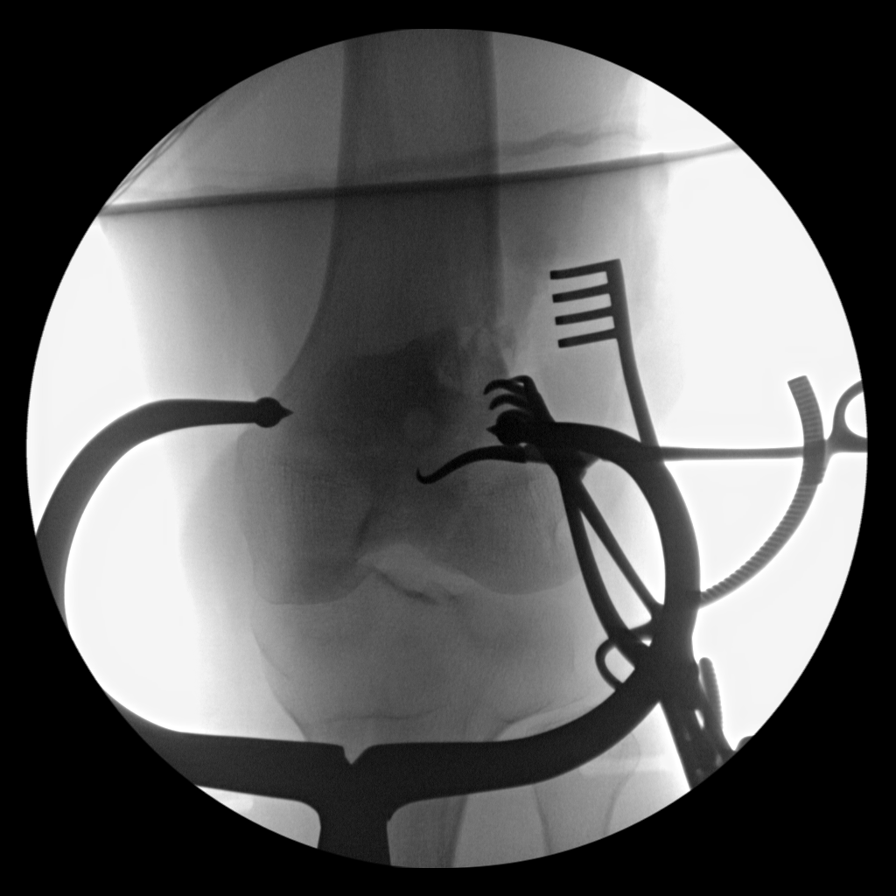
[im 2/6]
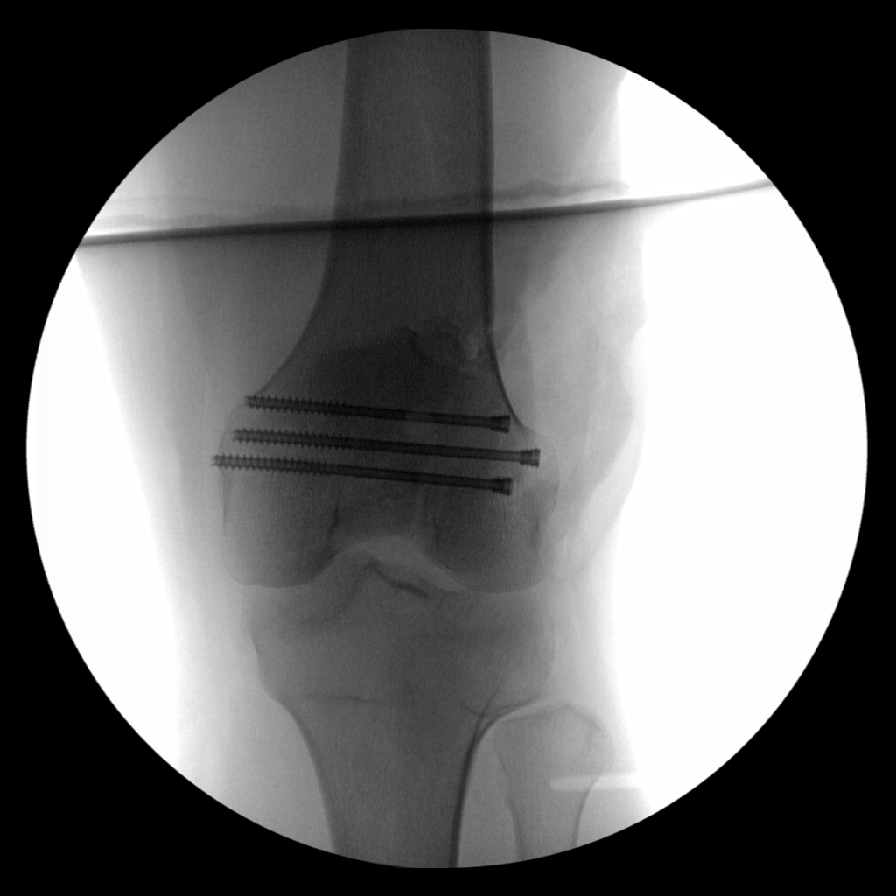
[im 3/6]
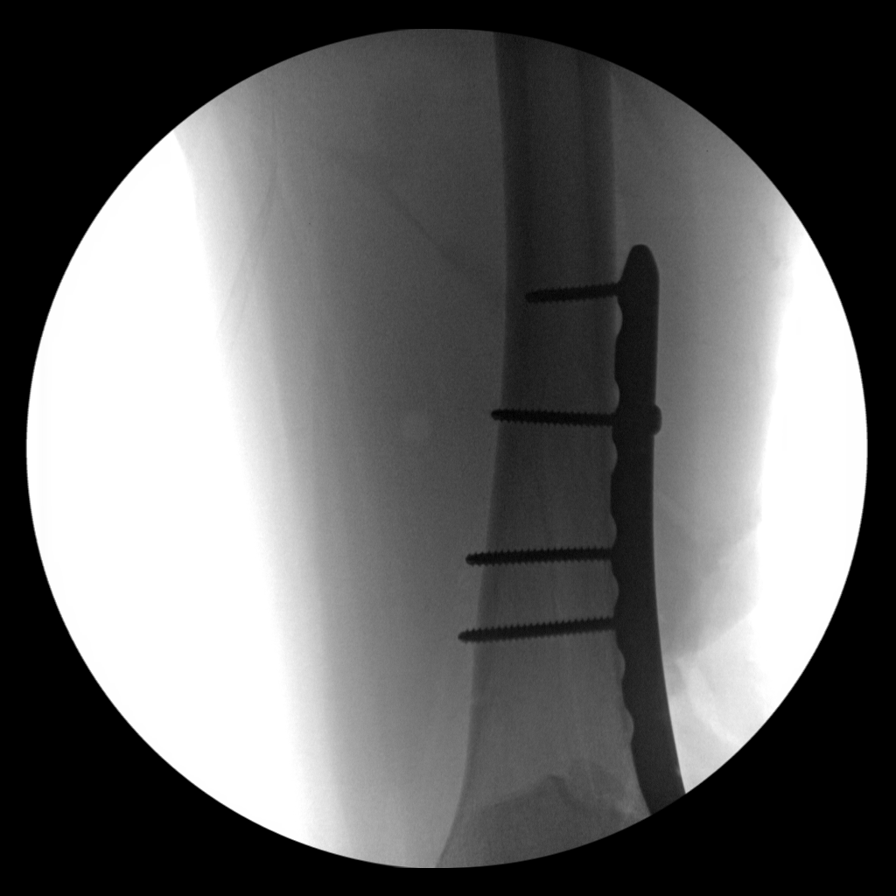
[im 4/6]
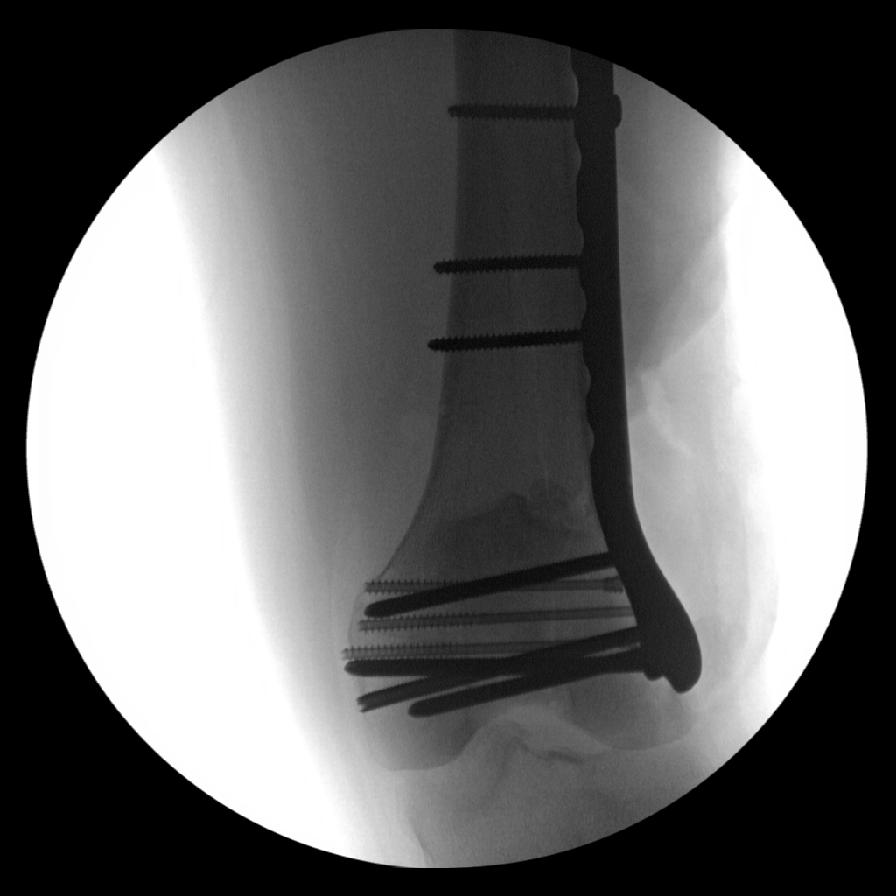
[im 5/6]
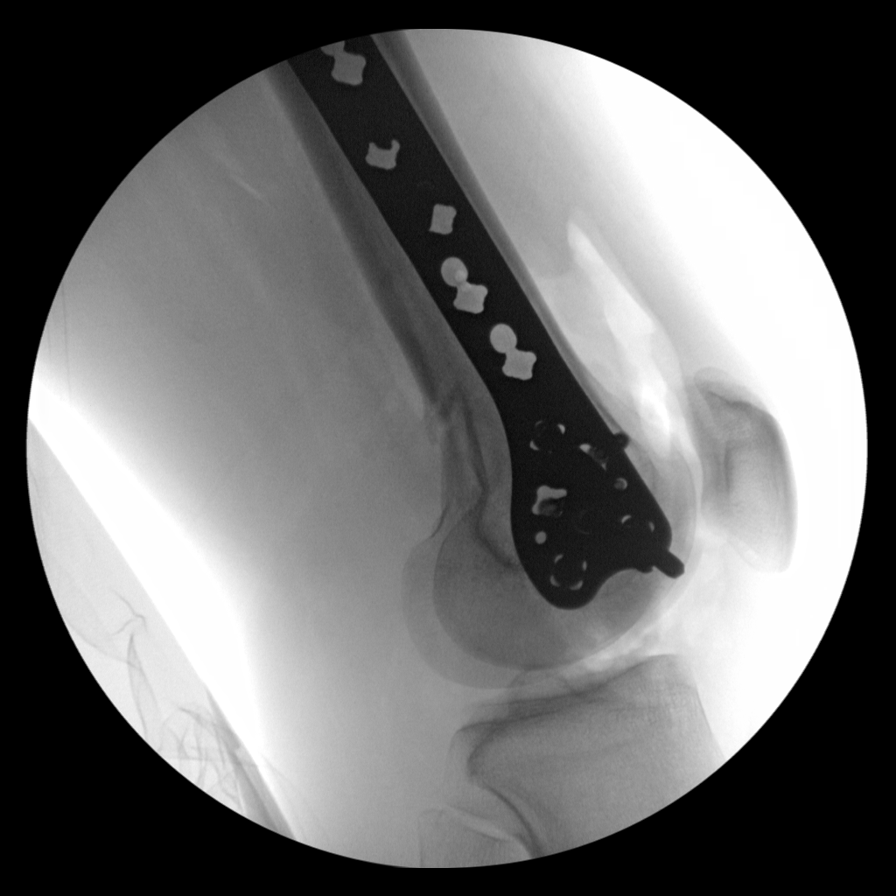
[im 6/6]
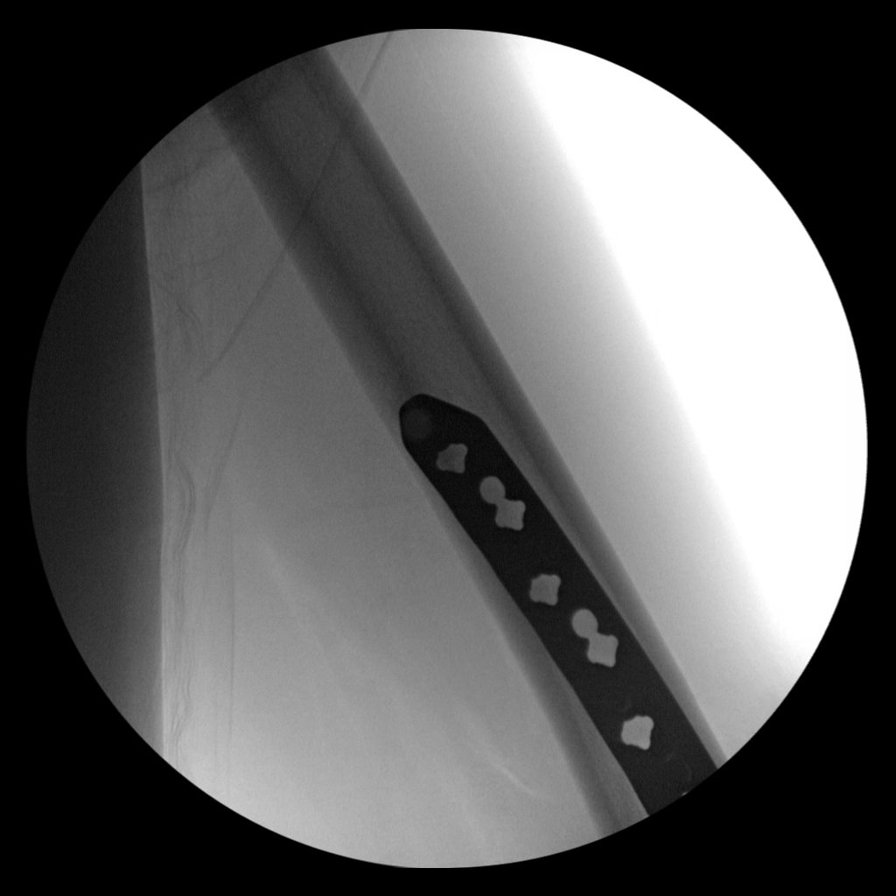

[6 of 6 positions shown; findings below may reference images not displayed]

FINDINGS: Fluoroscopic images show interval reduction and internal fixation of
comminuted fracture of distal left femur with metallic sideplate and
multiple surgical screws. Fluoroscopic time was 100 seconds.
Radiation dose is 6.75 mGy.
IMPRESSION: Fluoroscopic assistance was provided for reduction and internal
fixation of fracture of distal left femur.

## 2023-08-22 NOTE — ED Provider Notes (Signed)
 Chief Complaint  Patient presents with  . Leg Pain       HPI Patient is a 28 year old male with a history of left knee surgery distal femur fracture 2023 status post ORIF presenting to the emergency department with acute on chronic left knee pain.  Patient denies any recent trauma, denies any swelling, color changes on skin, popping sensation, numbness, tingling or any other symptoms.  Patient reports that he went to physical therapy however did not complete physical therapy workup.  Patient denies all other complaints at this time, including chest pain, shortness of breath.       Patient History History reviewed. No pertinent past medical history. History reviewed. No pertinent surgical history. No family history on file. Social History   Tobacco Use  . Smoking status: Never  . Smokeless tobacco: Never  Substance Use Topics  . Alcohol use: Not Currently  . Drug use: Not Currently      Review of Systems Review of Systems  Constitutional:  Negative for chills and fever.  Musculoskeletal:        Left knee pain      Physical Exam ED Triage Vitals [08/22/23 1758]  Temp 96.9 F (36.1 C)  Heart Rate 77  Resp 16  BP (!) 134/91  MAP (mmHg)   SpO2 98 %  O2 Device None (Room air)  O2 Flow Rate (L/min)   Weight    Physical Exam Constitutional:      Appearance: Normal appearance.  HENT:     Head: Normocephalic.     Nose: Nose normal. No congestion or rhinorrhea.  Eyes:     Extraocular Movements: Extraocular movements intact.     Pupils: Pupils are equal, round, and reactive to light.  Cardiovascular:     Rate and Rhythm: Normal rate.  Pulmonary:     Effort: Pulmonary effort is normal.     Breath sounds: Normal breath sounds.  Abdominal:     General: Abdomen is flat.     Palpations: Abdomen is soft.  Genitourinary:    Penis: Normal.   Musculoskeletal:        General: No swelling or tenderness. Normal range of motion.     Cervical back: Normal  range of motion.     Comments: MSK exam reveals 2+ radial, ulnar, DP, PT pulses.  Sensation preserved throughout all dermatomes, full range of motion to the proximal and distal joints.  Skin exam unremarkable for acute abnormalities other than what stated.   Skin:    General: Skin is warm and dry.  Neurological:     General: No focal deficit present.     Mental Status: He is alert and oriented to person, place, and time.  Psychiatric:        Behavior: Behavior normal.        CHA2DS2-VASc Score: N/A  Glasgow Coma Scale Score: 15                   Procedures                       ED Course & MDM   Medical Decision Making Patient is a 28 year old male with distal left femur fracture status post operative intervention with indwelling hardware presenting to the emergency department with acute on chronic left knee pain.  Patient denies all infectious symptoms, denies any leg swelling or skin changes.  On exam vital signs show slightly elevated  blood pressure otherwise within normal limits.  MSK exam shows full range of motion of the bilateral lower extremities, no crepitus, swelling, fluctuance, erythema.  Patient is sensation preserved throughout all dermatomes,.  Patient's presentation is concerning for possible post operative complication however x-ray shows appropriate alignment of screws and no postoperative complication.  Patient physical exam shows no evidence of infection.  Patient has no swelling, calf tenderness, calf pain, making DVT unlikely.  Patient given Tylenol , gabapentin  and lidocaine  patch with improvement of symptoms, discharge with physical therapy follow-up and instruction to follow-up with orthopedic surgery for possible MRI and reassessment for outpatient modification of surgery.  Patient able to ambulate out of the emergency department without concern.  Problems Addressed: Chronic pain of left knee: complicated acute illness or injury  Amount and/or Complexity of Data  Reviewed Radiology: ordered.  Risk OTC drugs. Prescription drug management.      ED Disposition:  Discharge Final diagnoses:  Chronic pain of left knee    ED Prescriptions   None

## 2023-08-22 NOTE — ED Notes (Signed)
 Pt discharged with no issues. Discharge instructions reviewed with pt and verbalized understanding.  Encouraged to follow up with primary care provider.  Pt also encouraged to return to Emergency department if anything changes or issues arise.    Krystal CHRISTELLA Nettles, RN 08/22/23 2040

## 2024-01-30 ENCOUNTER — Emergency Department (HOSPITAL_COMMUNITY)
Admission: EM | Admit: 2024-01-30 | Discharge: 2024-01-30 | Disposition: A | Discharge status: Home / Self Care | Payer: MEDICAID

## 2024-01-30 ENCOUNTER — Other Ambulatory Visit: Payer: Self-pay

## 2024-01-30 ENCOUNTER — Emergency Department (HOSPITAL_COMMUNITY): Payer: MEDICAID

## 2024-01-30 DIAGNOSIS — M79662 Pain in left lower leg: Secondary | ICD-10-CM | POA: Insufficient documentation

## 2024-01-30 DIAGNOSIS — M25562 Pain in left knee: Secondary | ICD-10-CM | POA: Insufficient documentation

## 2024-01-30 DIAGNOSIS — M79605 Pain in left leg: Secondary | ICD-10-CM

## 2024-01-30 MED ORDER — IBUPROFEN 800 MG PO TABS: 800.0000 mg | ORAL_TABLET | Freq: Three times a day (TID) | ORAL | 0 refills | Status: AC

## 2024-01-30 MED ORDER — LIDOCAINE 5 % EX PTCH: 1.0000 | MEDICATED_PATCH | CUTANEOUS | 0 refills | Status: AC
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
## 2024-01-30 NOTE — Discharge Instructions (Addendum)
 Please follow up with orthopedic surgery. You do have some arthritic changes in this knee. The hardware looks like it is in place. There is no broken hardware.   We will try high dose ibuprofen  as first line. You can also try lidocaine  patches.   Ultimately, orthopedic surgery will need to see you.     You can take tylenol  as well. Do not take more than 4g or 4000 mg in a 24 hr period. You can take 1000 mg every 6-8 hours   If you develop any fevers and the knee is warm then please come back to the ED.

## 2024-01-30 NOTE — ED Triage Notes (Addendum)
 Patient to ED by POV with L leg pain states he had surgery 2 years ago and fall  2 weeks ago. Has limited ROM yet able to bear weight.
